# Patient Record
Sex: Male | Born: 1960 | Race: Black or African American | Hispanic: No | Marital: Married | Smoking: Former smoker
Health system: Southern US, Community
[De-identification: ages and names within clinical notes are randomized; demographics above are authoritative.]

## PROBLEM LIST (undated history)

## (undated) DIAGNOSIS — J45909 Unspecified asthma, uncomplicated: Secondary | ICD-10-CM

## (undated) DIAGNOSIS — E119 Type 2 diabetes mellitus without complications: Secondary | ICD-10-CM

## (undated) DIAGNOSIS — I10 Essential (primary) hypertension: Secondary | ICD-10-CM

---

## 2016-12-31 ENCOUNTER — Emergency Department (HOSPITAL_COMMUNITY)
Admission: EM | Admit: 2016-12-31 | Discharge: 2016-12-31 | Disposition: A | Discharge status: Home / Self Care | Payer: Self-pay | Attending: Emergency Medicine | Admitting: Emergency Medicine

## 2016-12-31 ENCOUNTER — Encounter (HOSPITAL_COMMUNITY): Payer: Self-pay | Admitting: Emergency Medicine

## 2016-12-31 ENCOUNTER — Emergency Department (HOSPITAL_COMMUNITY): Payer: Self-pay

## 2016-12-31 DIAGNOSIS — J9801 Acute bronchospasm: Secondary | ICD-10-CM | POA: Insufficient documentation

## 2016-12-31 DIAGNOSIS — I10 Essential (primary) hypertension: Secondary | ICD-10-CM | POA: Insufficient documentation

## 2016-12-31 DIAGNOSIS — J4541 Moderate persistent asthma with (acute) exacerbation: Secondary | ICD-10-CM

## 2016-12-31 DIAGNOSIS — E119 Type 2 diabetes mellitus without complications: Secondary | ICD-10-CM | POA: Insufficient documentation

## 2016-12-31 DIAGNOSIS — Z87891 Personal history of nicotine dependence: Secondary | ICD-10-CM | POA: Insufficient documentation

## 2016-12-31 HISTORY — DX: Type 2 diabetes mellitus without complications: E11.9

## 2016-12-31 HISTORY — DX: Essential (primary) hypertension: I10

## 2016-12-31 HISTORY — DX: Unspecified asthma, uncomplicated: J45.909

## 2016-12-31 MED ORDER — ALBUTEROL SULFATE HFA 108 (90 BASE) MCG/ACT IN AERS
2.0000 | INHALATION_SPRAY | Freq: Four times a day (QID) | RESPIRATORY_TRACT | Status: DC | PRN
  Administered 2016-12-31: 2 via RESPIRATORY_TRACT
  Filled 2016-12-31: qty 6.7

## 2016-12-31 MED ORDER — DEXAMETHASONE 4 MG PO TABS
8.0000 mg | ORAL_TABLET | Freq: Once | ORAL | Status: AC
  Administered 2016-12-31: 8 mg via ORAL
  Filled 2016-12-31: qty 2

## 2016-12-31 MED ORDER — ALBUTEROL SULFATE (2.5 MG/3ML) 0.083% IN NEBU
5.0000 mg | INHALATION_SOLUTION | Freq: Once | RESPIRATORY_TRACT | Status: AC
  Administered 2016-12-31: 5 mg via RESPIRATORY_TRACT
  Filled 2016-12-31: qty 6

## 2016-12-31 MED ORDER — PREDNISONE 20 MG PO TABS: 60.0000 mg | ORAL_TABLET | Freq: Once | ORAL | Status: DC

## 2016-12-31 MED ORDER — ALBUTEROL SULFATE HFA 108 (90 BASE) MCG/ACT IN AERS: 1.0000 | INHALATION_SPRAY | Freq: Four times a day (QID) | RESPIRATORY_TRACT | 0 refills | Status: AC | PRN

## 2016-12-31 NOTE — ED Provider Notes (Signed)
[facility]-EMERGENCY DEPT Provider Note   CSN: [id number] Arrival date & time: 12/31/16  [id number]     History   Chief Complaint Chief Complaint  Patient presents with  . Wheezing    HPI Arthur Adams is a 56 y.o. male.  The history is provided by the patient.  Wheezing   This is a new problem. The current episode started yesterday. The problem has been gradually worsening. Associated symptoms include cough and sputum production. Pertinent negatives include no chest pain, no fever and no hemoptysis. His past medical history is significant for asthma.  [place] [facility] [place] and did not bring his albuterol inhaler.  Over the past day he has had cough/congestion and wheezing No CP He has long h/o asthma Denies h/o ICU admits He reports this is similar to all prior episodes   Past Medical History:  Diagnosis Date  . Asthma   . Diabetes mellitus without complication (HCC)   . Hypertension     There are no active problems to display for this patient.   History reviewed. No pertinent surgical history.     Home Medications    Prior to Admission medications   Not on File    Family History No family history on file.  Social History Social History  Substance Use Topics  . Smoking status: Former Games developermoker  . Smokeless tobacco: Never Used  . Alcohol use No     Allergies   Patient has no known allergies.   Review of Systems Review of Systems  Constitutional: Negative for fever.  Respiratory: Positive for cough, sputum production and wheezing. Negative for hemoptysis.   Cardiovascular: Negative for chest pain.  All other systems reviewed and are negative.    Physical Exam Updated Vital Signs BP (!) 163/116 (BP Location: Right Arm)   Pulse 89   Temp 97.6 F (36.4 C) (Axillary)   Resp 18   SpO2 97%   Physical Exam CONSTITUTIONAL: Well developed/well nourished HEAD: Normocephalic/atraumatic EYES: EOMI/PERRL ENMT: Mucous membranes moist, uvula  midline, no stridor and no exudates NECK: supple no meningeal signs SPINE/BACK:entire spine nontender CV: S1/S2 noted, no murmurs/rubs/gallops noted LUNGS: scattered wheezing bilaterally, no distress noted ABDOMEN: soft, nontender, no rebound or guarding, bowel sounds noted throughout abdomen GU:no cva tenderness NEURO: Pt is awake/alert/appropriate, moves all extremitiesx4.  No facial droop.   EXTREMITIES: pulses normal/equal, full ROM SKIN: warm, color normal PSYCH: no abnormalities of mood noted, alert and oriented to situation   ED Treatments / Results  Labs (all labs ordered are listed, but only abnormal results are displayed) Labs Reviewed - No data to display  EKG  EKG Interpretation  Date/Time:  Saturday December 31 2016 05:35:57 EDT Ventricular Rate:  85 PR Interval:    QRS Duration: 103 QT Interval:  377 QTC Calculation: 449 R Axis:   -52 Text Interpretation:  Sinus rhythm LAD, consider left anterior fascicular block Borderline T abnormalities, lateral leads No previous ECGs available Confirmed by Zadie RhineWickline, Renell Coaxum ([id number]) on 12/31/2016 5:45:30 AM       Radiology Dg Chest 2 View  Result Date: 12/31/2016 CLINICAL DATA:  Wheezing and shortness of breath today. EXAM: CHEST  2 VIEW COMPARISON:  None. FINDINGS: Borderline hyperinflation. The cardiomediastinal contours are normal. Pulmonary vasculature is normal. No consolidation, pleural effusion, or pneumothorax. No acute osseous abnormalities are seen. IMPRESSION: Borderline hyperinflation, suggesting asthma or bronchitis, of uncertain chronicity. No localizing abnormality. Electronically Signed   By: Rubye OaksMelanie  Ehinger M.D.   On: 12/31/2016 05:54  Procedures Procedures (including critical care time)  Medications Ordered in ED Medications  albuterol (PROVENTIL HFA;VENTOLIN HFA) 108 (90 Base) MCG/ACT inhaler 2 puff (2 puffs Inhalation Given 12/31/16 0620)  albuterol (PROVENTIL) (2.5 MG/3ML) 0.083% nebulizer solution 5 mg  (5 mg Nebulization Given 12/31/16 0535)  dexamethasone (DECADRON) tablet 8 mg (8 mg Oral Given 12/31/16 1610)     Initial Impression / Assessment and Plan / ED Course  I have reviewed the triage vital signs and the nursing notes.  Pt improved, walking around in no distress Requesting d/c home      Final Clinical Impressions(s) / ED Diagnoses   Final diagnoses:  Bronchospasm  Moderate persistent asthma with exacerbation    New Prescriptions Discharge Medication List as of 12/31/2016  6:35 AM    START taking these medications   Details  albuterol (PROVENTIL HFA;VENTOLIN HFA) 108 (90 Base) MCG/ACT inhaler Inhale 1-2 puffs into the lungs every 6 (six) hours as needed for wheezing or shortness of breath., Starting Sat 12/31/2016, Print         Zadie Rhine, MD 12/31/16 [phone]

## 2016-12-31 NOTE — ED Notes (Signed)
Pt states he is always congested now due to breaking his nose 2 months ago.

## 2016-12-31 NOTE — ED Triage Notes (Signed)
Pt to ED for SOB for last hour. Pt has insp and exp wheezing. 3-4 word sentences. Pt has Hx of asthma. No Hx of COPD. No recent illness noted. Pt A&Ox4 in triage. No complaints of pain.

## 2016-12-31 NOTE — ED Notes (Signed)
Pt verbalized understanding of d/c instructions and has no further questions. Pt is stable, A&Ox4, VSS.  

## 2017-08-11 DEATH — deceased

## 2018-02-17 IMAGING — CR DG CHEST 2V
2 series · 2 of 2 positions shown · non-contrast
Comparison: None.

CLINICAL DATA: Wheezing and shortness of breath today.

EXAM:
CHEST  2 VIEW

[chest pa]
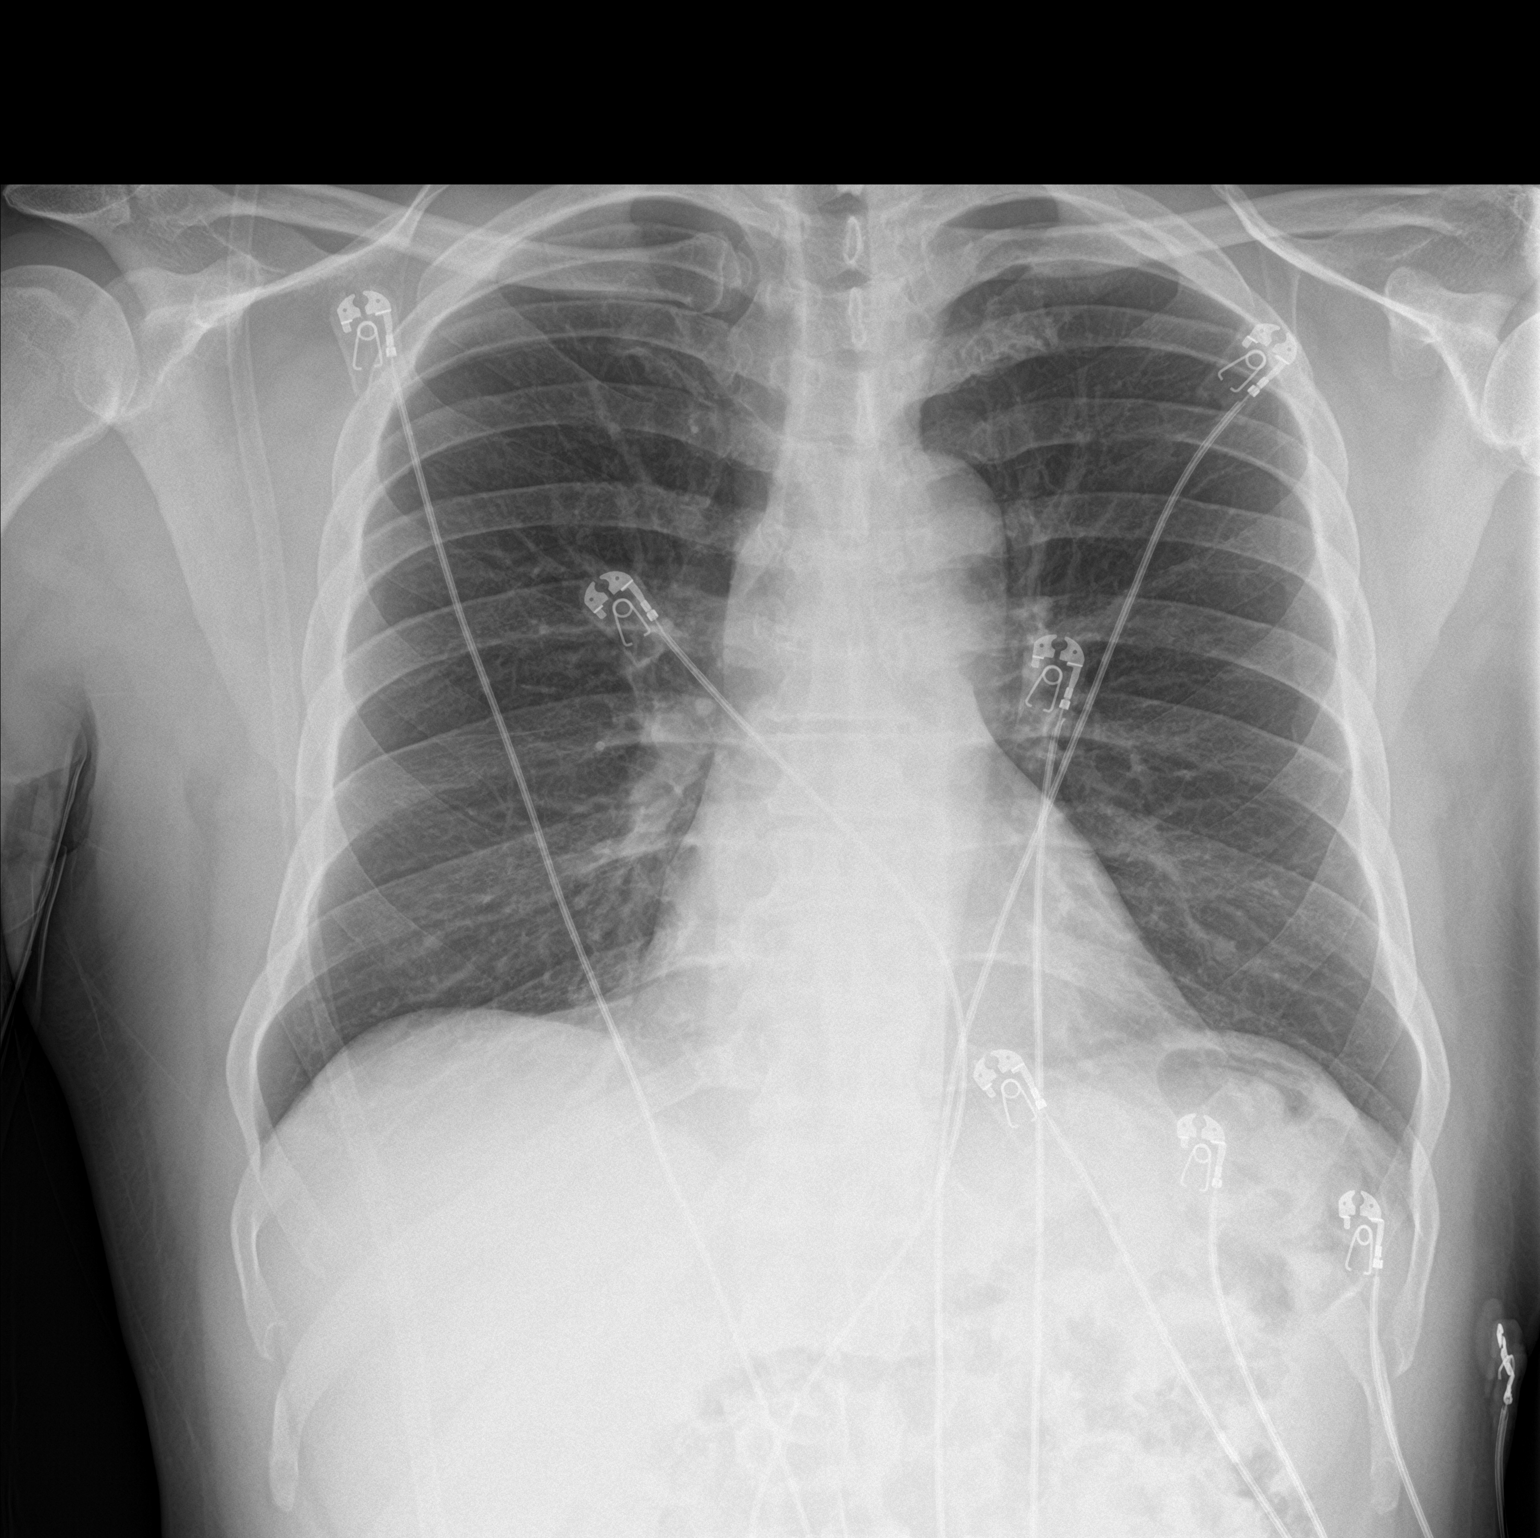

[chest lat]
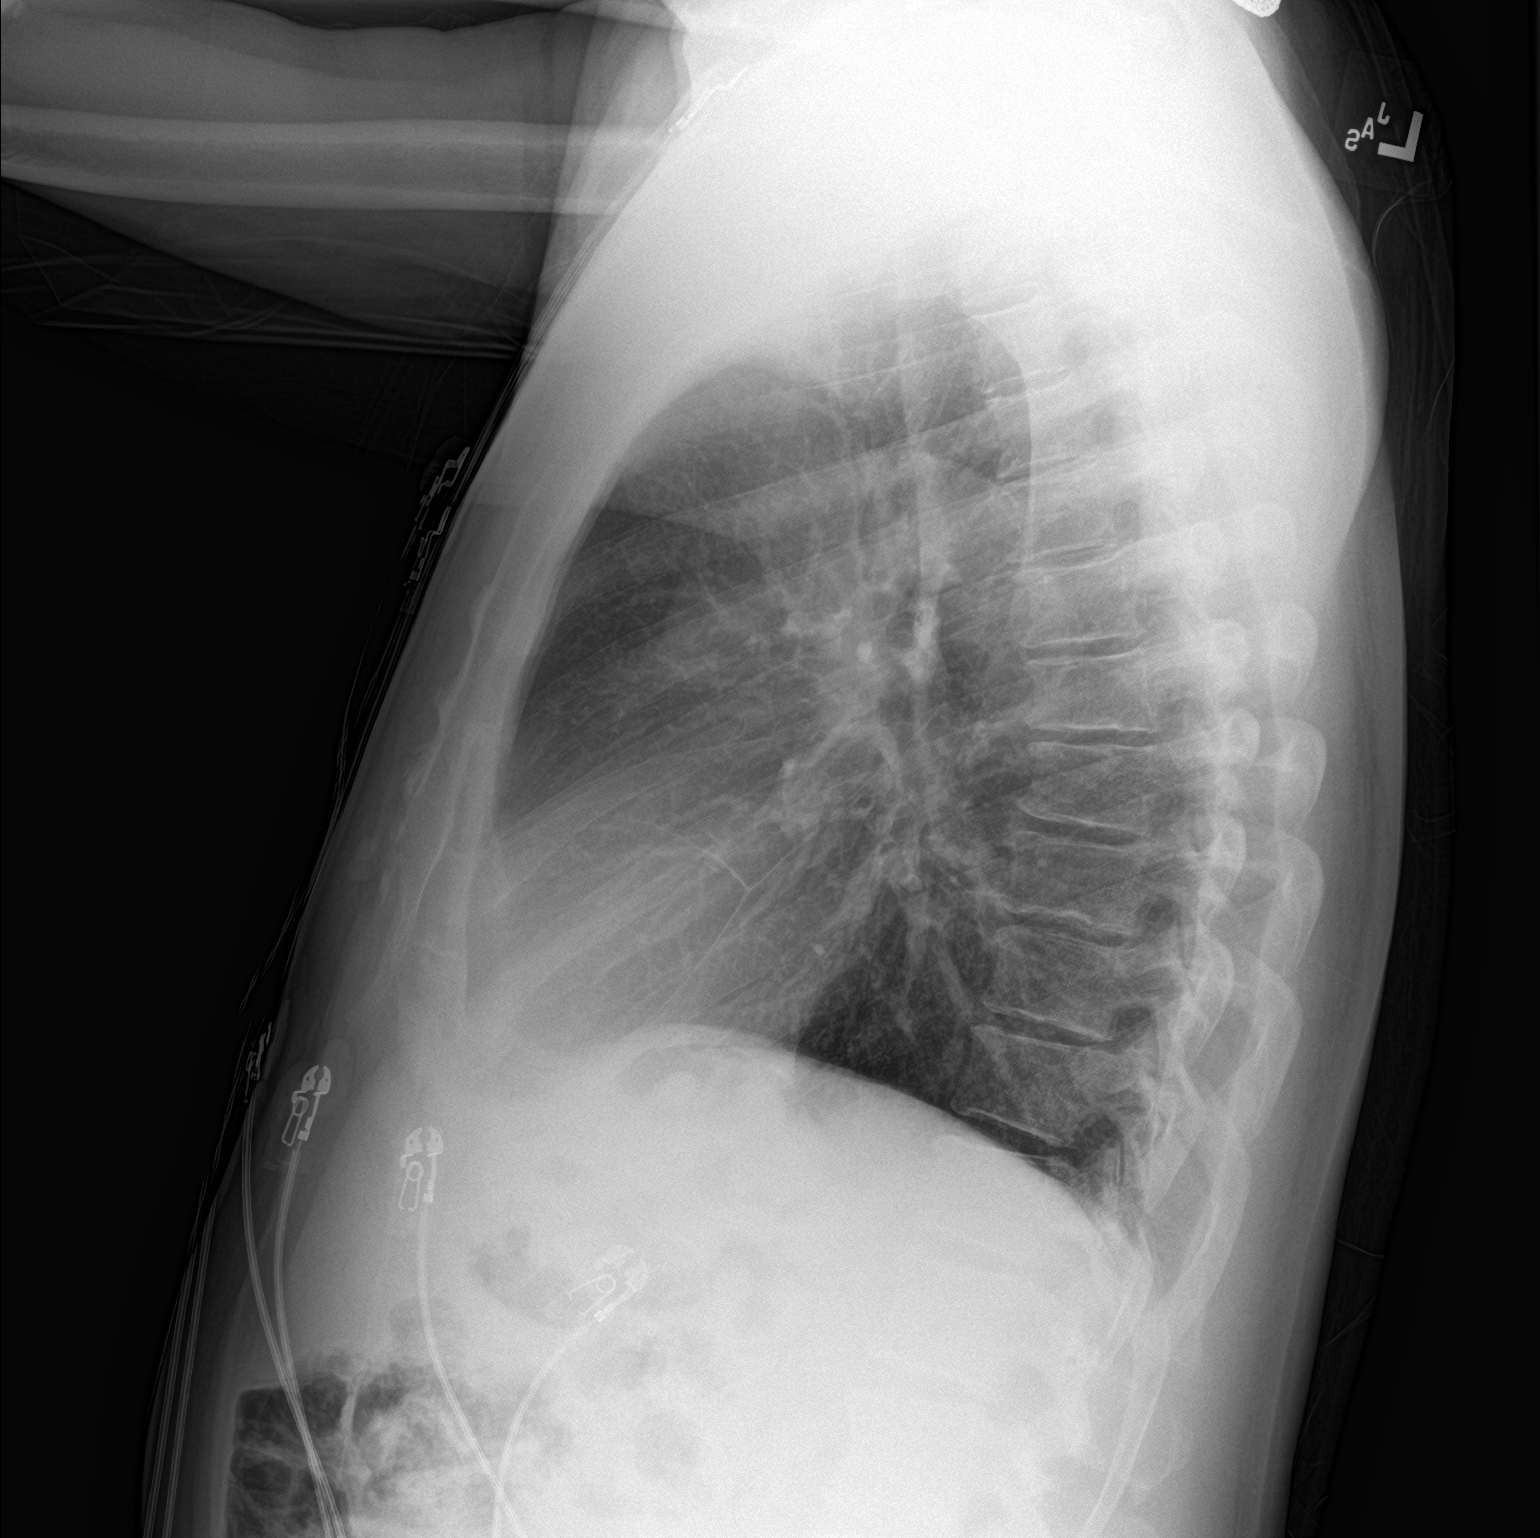

[2 of 2 positions shown; findings below may reference images not displayed]

FINDINGS: Borderline hyperinflation. The cardiomediastinal contours are
normal. Pulmonary vasculature is normal. No consolidation, pleural
effusion, or pneumothorax. No acute osseous abnormalities are seen.
IMPRESSION: Borderline hyperinflation, suggesting asthma or bronchitis, of
uncertain chronicity. No localizing abnormality.
